# Patient Record
Sex: Female | Born: 1970 | Race: Black or African American | Hispanic: No | Marital: Single | Smoking: Never smoker
Health system: Southern US, Community
[De-identification: ages and names within clinical notes are randomized; demographics above are authoritative.]

## PROBLEM LIST (undated history)

## (undated) ENCOUNTER — Inpatient Hospital Stay (HOSPITAL_COMMUNITY): Payer: Self-pay

## (undated) DIAGNOSIS — Z789 Other specified health status: Secondary | ICD-10-CM

---

## 1997-09-04 ENCOUNTER — Inpatient Hospital Stay (HOSPITAL_COMMUNITY): Admission: AD | Admit: 1997-09-04 | Discharge: 1997-09-04 | Payer: Self-pay | Admitting: Obstetrics & Gynecology

## 2002-08-19 ENCOUNTER — Emergency Department (HOSPITAL_COMMUNITY): Admission: EM | Admit: 2002-08-19 | Discharge: 2002-08-19 | Payer: Self-pay | Admitting: Emergency Medicine

## 2002-09-30 ENCOUNTER — Other Ambulatory Visit: Admission: RE | Admit: 2002-09-30 | Discharge: 2002-09-30 | Payer: Self-pay | Admitting: *Deleted

## 2008-03-16 ENCOUNTER — Inpatient Hospital Stay (HOSPITAL_COMMUNITY): Admission: AD | Admit: 2008-03-16 | Discharge: 2008-03-16 | Payer: Self-pay | Admitting: Obstetrics and Gynecology

## 2008-03-19 ENCOUNTER — Inpatient Hospital Stay (HOSPITAL_COMMUNITY): Admission: AD | Admit: 2008-03-19 | Discharge: 2008-03-19 | Payer: Self-pay | Admitting: Family Medicine

## 2009-02-22 ENCOUNTER — Emergency Department (HOSPITAL_BASED_OUTPATIENT_CLINIC_OR_DEPARTMENT_OTHER): Admission: EM | Admit: 2009-02-22 | Discharge: 2009-02-22 | Payer: Self-pay | Admitting: Emergency Medicine

## 2009-09-15 ENCOUNTER — Inpatient Hospital Stay (HOSPITAL_COMMUNITY): Admission: AD | Admit: 2009-09-15 | Discharge: 2009-09-16 | Payer: Self-pay | Admitting: Obstetrics and Gynecology

## 2009-09-18 ENCOUNTER — Ambulatory Visit (HOSPITAL_COMMUNITY): Admission: RE | Admit: 2009-09-18 | Discharge: 2009-09-18 | Payer: Self-pay | Admitting: Obstetrics & Gynecology

## 2009-09-30 ENCOUNTER — Encounter: Payer: Self-pay | Admitting: Obstetrics and Gynecology

## 2009-09-30 ENCOUNTER — Ambulatory Visit: Payer: Self-pay | Admitting: Obstetrics and Gynecology

## 2009-10-14 ENCOUNTER — Ambulatory Visit: Payer: Self-pay | Admitting: Obstetrics and Gynecology

## 2010-08-10 LAB — POCT URINALYSIS DIP (DEVICE)
Bilirubin Urine: NEGATIVE
Glucose, UA: NEGATIVE mg/dL
Hgb urine dipstick: NEGATIVE
Nitrite: NEGATIVE
Specific Gravity, Urine: 1.02 (ref 1.005–1.030)
Urobilinogen, UA: 0.2 mg/dL (ref 0.0–1.0)
pH: 7 (ref 5.0–8.0)

## 2010-08-10 LAB — URINALYSIS, ROUTINE W REFLEX MICROSCOPIC
Ketones, ur: NEGATIVE mg/dL
Leukocytes, UA: NEGATIVE
Urobilinogen, UA: 0.2 mg/dL (ref 0.0–1.0)

## 2010-08-10 LAB — WET PREP, GENITAL

## 2010-08-10 LAB — URINE MICROSCOPIC-ADD ON: WBC, UA: NONE SEEN WBC/hpf (ref <3)

## 2010-08-10 LAB — CBC
Hemoglobin: 11.7 g/dL — ABNORMAL LOW (ref 12.0–15.0)
MCV: 86.4 fL (ref 78.0–100.0)
RBC: 4.11 MIL/uL (ref 3.87–5.11)
RDW: 14.4 % (ref 11.5–15.5)

## 2010-08-10 LAB — HCG, QUANTITATIVE, PREGNANCY
hCG, Beta Chain, Quant, S: 155 m[IU]/mL — ABNORMAL HIGH (ref <5)
hCG, Beta Chain, Quant, S: 368 m[IU]/mL — ABNORMAL HIGH (ref <5)

## 2010-08-10 LAB — GC/CHLAMYDIA PROBE AMP, GENITAL: GC Probe Amp, Genital: NEGATIVE

## 2010-10-05 NOTE — Assessment & Plan Note (Signed)
NAME:  Diamond Gutierrez, [place]SE NO.:  [id number]   MEDICAL RECORD NO.:  [id number]          PATIENT TYPE:  WOC   LOCATION:  [facility] at [facility]         FACILITY:  [facility]   PHYSICIAN:  Argentina Donovan, MD        DATE OF BIRTH:  [birth year]   DATE OF SERVICE:  09/30/2009                                  CLINIC NOTE   REASON FOR VISIT:  Followup probable SAB.   HISTORY:  This is a 40 year old G19, P 2-0-2-2 AA female who is here for  her first visit after being seen in [facility] on September 15, 2009, and September 18, 2009.  In addition to her followup visit, she is interested in getting a  Pap smear, very concerned that she has not had one for several years and  also has not had a physical for a long time.  When she was seen on September 15, 2009, she had had menstrual-like bleeding 5 days prior to the visit.  Her LMP, she thinks, was around July 30, 2009, which would have placed  her somewhere around 6 weeks, at the time of her first visit, on that  day her [name] was 70 and there was nothing seen in the uterus, trace of  free fluid and adnexa were normal.  She returned to [facility]U on April 29,  still having some light bleeding and her quant was down to 155.  She  continued to have very light spotting until about 3 days ago when she  has had no further spotting.  She is okay with becoming pregnant, but is  not really trying to get pregnant.  Her children are in high school and  college.  She does not like birth control because she is afraid of the  risks of estrogen and that when she used the patch, it left spots on her  skin and when she used Depo-Provera, she gained weight.  She also states  she has been with the same partner for 19 years and rarely has  intercourse.   ALLERGIES:  None.   CURRENT MEDICATIONS:  No prescription meds.  She does take bee pollen  which she thinks helps with her weight loss and she does colon cleansing  which is formulation from the health food service, she has been  on for  about a year.  She states her usual pattern of constipation before using  this was having one bowel movement a week with some straining and  feeling like she did not completely emptied her bowels.  Since she has  been using the colon cleanse, she is doing much better and is only  concerned of being on it for a long time.  She is very motivated to lose  weight and has in fact gotten down to her weight today of 165 from being  as high as 199.  She has joined a gym and does regular exercise and she  has tried to adjust her diet to promote weight loss.   IMMUNIZATION HISTORY:  She has had the usual immunizations.   MENSTRUAL HISTORY:  She has regular monthly cycles with medium flow,  very mild cramping.  CONTRACEPTIVE HISTORY:  As above.   OBSTETRICAL HISTORY:  She had 1 NSVD and 1 C-section.  Last year, she  also had a early SAB.   GYNECOLOGIC HISTORY:  It has been several years since the last Pap, but  she has never had an abnormal Pap.  Never had a mammogram.   SURGERIES:  None.   BLOOD TRANSFUSION:  None.   FAMILY HISTORY:  Significant for grandmother with diabetes and mother  with hypertension.  No history of cancer or blood clots in her family.   PAST MEDICAL HISTORY:  Completely negative.  However, she has not been  getting any routine primary care.   SOCIAL HISTORY:  Lives with daughter and fiance.  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  She is a nonsmoker.  Drinks socially caffeinated  beverages 1-2 a day.  No history of IV drug use or abuse.   REVIEW OF SYMPTOMS:  Negative, except she reports some blood seen in the  stools during the time she was having her miscarriage, so she is  uncertain whether that isolated instances due to straining of the stool  or was menstrual or was blood with the SAB.  She denies any vaginal  itching, odor or dyspareunia.   PHYSICAL EXAMINATION:  VITAL SIGNS:  BP 132/82, pulse 72, weight 165,  height 5 feet 5 inches.   GENERAL:  WN, WD in NAD.  She seems a bit anxious.  HEENT:  Normocephalic.  Good dentition.  NECK:  Thyroid WNL.  BREASTS:  Enlarged with no discrete mass.  No nipple discharge.  No  lymphadenopathy.  ABDOMEN:  Soft, nontender.  No organomegaly noted.  EXTREMITIES:  Without varicosities.  PELVIC:  NEFG.  Good tone and support.  Vagina; pink, rugated.  Cervix;  posterior, clean, parous os.  Bimanual, uterus NSSP.  No cervical motion  tenderness.  No adnexal tenderness or masses.   IMPRESSION AND PLAN:  Status post spontaneous abortion is most likely.  Today, we will repeat her quant and if it is down to 0, she will not  need it again, otherwise we will need to follow down to 0 because there  was never an IUP documented.  Health care maintenance, she is praised on  her weight loss and her motivation enjoying in the gym.  We discussed  diet at length regarding weight reduction and regarding consultation.  She is advised to decrease her use of the colon cleanse and instead  adjust her diet to add high-fiber foods.  Pap smear is done, so followup  will be pending results of that.  Also urinalysis is done which is  negative and this is reviewed with the patient.     ______________________________  Caren Griffins, CNM    ______________________________  Argentina Donovan, MD    DP/MEDQ  D:  09/30/2009  T:  10/01/2009  Job:  [id number]

## 2011-02-22 LAB — CBC
HCT: 34.1 — ABNORMAL LOW
MCV: 80.5
RDW: 16.5 — ABNORMAL HIGH

## 2011-02-22 LAB — URINE CULTURE

## 2011-02-22 LAB — GC/CHLAMYDIA PROBE AMP, GENITAL
Chlamydia, DNA Probe: NEGATIVE
GC Probe Amp, Genital: NEGATIVE

## 2011-02-22 LAB — ABO/RH: ABO/RH(D): A POS

## 2011-02-22 LAB — POCT PREGNANCY, URINE: Preg Test, Ur: POSITIVE

## 2011-02-22 LAB — URINALYSIS, ROUTINE W REFLEX MICROSCOPIC
Glucose, UA: NEGATIVE
Ketones, ur: NEGATIVE
Protein, ur: NEGATIVE
pH: 7.5

## 2011-02-22 LAB — WET PREP, GENITAL
Clue Cells Wet Prep HPF POC: NONE SEEN
Trich, Wet Prep: NONE SEEN

## 2011-02-22 LAB — URINE MICROSCOPIC-ADD ON

## 2011-02-22 LAB — HCG, QUANTITATIVE, PREGNANCY: hCG, Beta Chain, Quant, S: 1061 — ABNORMAL HIGH

## 2011-06-04 ENCOUNTER — Inpatient Hospital Stay (HOSPITAL_COMMUNITY): Payer: Self-pay

## 2011-06-04 ENCOUNTER — Encounter (HOSPITAL_COMMUNITY): Payer: Self-pay | Admitting: Obstetrics and Gynecology

## 2011-06-04 ENCOUNTER — Inpatient Hospital Stay (HOSPITAL_COMMUNITY)
Admission: AD | Admit: 2011-06-04 | Discharge: 2011-06-04 | Disposition: A | Discharge status: Home / Self Care | Payer: Self-pay | Source: Ambulatory Visit | Attending: Obstetrics and Gynecology | Admitting: Obstetrics and Gynecology

## 2011-06-04 DIAGNOSIS — N83209 Unspecified ovarian cyst, unspecified side: Secondary | ICD-10-CM | POA: Insufficient documentation

## 2011-06-04 DIAGNOSIS — N83299 Other ovarian cyst, unspecified side: Secondary | ICD-10-CM

## 2011-06-04 DIAGNOSIS — O210 Mild hyperemesis gravidarum: Secondary | ICD-10-CM | POA: Insufficient documentation

## 2011-06-04 DIAGNOSIS — O219 Vomiting of pregnancy, unspecified: Secondary | ICD-10-CM

## 2011-06-04 DIAGNOSIS — O34599 Maternal care for other abnormalities of gravid uterus, unspecified trimester: Secondary | ICD-10-CM | POA: Insufficient documentation

## 2011-06-04 HISTORY — DX: Other specified health status: Z78.9

## 2011-06-04 LAB — WET PREP, GENITAL: Trich, Wet Prep: NONE SEEN

## 2011-06-04 LAB — DIFFERENTIAL
Basophils Absolute: 0 10*3/uL (ref 0.0–0.1)
Basophils Relative: 0 % (ref 0–1)
Eosinophils Absolute: 0.2 10*3/uL (ref 0.0–0.7)
Monocytes Relative: 17 % — ABNORMAL HIGH (ref 3–12)
Neutro Abs: 4.2 10*3/uL (ref 1.7–7.7)
Neutrophils Relative %: 57 % (ref 43–77)

## 2011-06-04 LAB — URINALYSIS, ROUTINE W REFLEX MICROSCOPIC
Bilirubin Urine: NEGATIVE
Ketones, ur: 15 mg/dL — AB
Nitrite: NEGATIVE
pH: 7 (ref 5.0–8.0)

## 2011-06-04 LAB — URINE MICROSCOPIC-ADD ON

## 2011-06-04 LAB — CBC
Hemoglobin: 11.8 g/dL — ABNORMAL LOW (ref 12.0–15.0)
MCH: 27.8 pg (ref 26.0–34.0)
MCHC: 33.3 g/dL (ref 30.0–36.0)
Platelets: 343 10*3/uL (ref 150–400)
RDW: 14.5 % (ref 11.5–15.5)

## 2011-06-04 LAB — HCG, QUANTITATIVE, PREGNANCY: hCG, Beta Chain, Quant, S: 62378 m[IU]/mL — ABNORMAL HIGH (ref <5)

## 2011-06-04 LAB — ABO/RH: ABO/RH(D): A POS

## 2011-06-04 MED ORDER — ONDANSETRON 8 MG PO TBDP
8.0000 mg | ORAL_TABLET | Freq: Once | ORAL | Status: AC
  Administered 2011-06-04: 8 mg via ORAL
  Filled 2011-06-04: qty 1

## 2011-06-04 MED ORDER — HYDROCODONE-ACETAMINOPHEN 5-325 MG PO TABS: 2.0000 | ORAL_TABLET | ORAL | Status: AC | PRN

## 2011-06-04 MED ORDER — PROMETHAZINE HCL 25 MG PO TABS: 12.5000 mg | ORAL_TABLET | Freq: Four times a day (QID) | ORAL | Status: AC | PRN

## 2011-06-04 NOTE — Progress Notes (Signed)
Pt states, " I have missed my December period, and now I've had vomiting late night and early morning for 3 wks. I want to be sure I'm not pregnant in my tubes. My low back has been hurting for two weeks."

## 2011-06-04 NOTE — ED Provider Notes (Signed)
History     CSN: [id number]  Arrival date & time 06/04/11  0202   None     Chief Complaint  Patient presents with  . Emesis  . Back Pain    HPI Diamond Gutierrez is a 41 y.o. female who presents to [facility]U for nausea and vomiting in early pregnancy. She also complains of low back pain radiating to the lower abdomen. The history was provided by the patient.   Past Medical History  Diagnosis Date  . No pertinent past medical history     Past Surgical History  Procedure Date  . Cesarean section 11/23/88    History reviewed. No pertinent family history.  History  Substance Use Topics  . Smoking status: Never Smoker   . Smokeless tobacco: Not on file  . Alcohol Use: Yes     "I used to occasionally drink, but not now."    OB History    Grav Para Term Preterm Abortions TAB SAB Ect Mult Living   4 2 2  1  1   2       Review of Systems  Constitutional: Positive for appetite change. Negative for fever and unexpected weight change.  HENT: Positive for congestion.   Eyes: Negative.   Respiratory: Negative for cough and wheezing.   Cardiovascular: Negative.   Gastrointestinal: Positive for nausea and vomiting. Negative for diarrhea and constipation. Abdominal pain: cramping.  Genitourinary: Positive for frequency. Negative for dysuria, urgency, vaginal bleeding, vaginal discharge and pelvic pain.  Musculoskeletal: Negative for back pain.  Skin: Negative for rash.  Neurological: Positive for headaches. Negative for dizziness and syncope.  Psychiatric/Behavioral: The patient is not nervous/anxious.     Allergies  Review of patient's allergies indicates no known allergies.  Home Medications  No current outpatient prescriptions on file.  BP 119/100  Pulse 94  Temp(Src) 98.4 F (36.9 C) (Oral)  Resp 16  Ht 5' 5.5" (1.664 m)  Wt 171 lb (77.565 kg)  BMI 28.02 kg/m2  LMP 04/02/2011  Physical Exam  Nursing note and vitals reviewed. Constitutional: She is oriented to person,  place, and time. She appears well-developed and well-nourished. No distress.       Elevated BP  HENT:  Head: Normocephalic.  Eyes: EOM are normal.  Neck: Neck supple.  Cardiovascular: Normal rate.   Pulmonary/Chest: Effort normal.  Abdominal: Soft. There is no tenderness.  Genitourinary:       External genitalia without lesions. White discharge vaginal vault. Cervix closed, long, no CMT, right adnexal tenderness. Uterus approximately 8 to 10 week size.  Musculoskeletal: Normal range of motion.  Neurological: She is alert and oriented to person, place, and time. No cranial nerve deficit.  Skin: Skin is warm and dry.  Psychiatric: She has a normal mood and affect. Her behavior is normal. Judgment and thought content normal.    Results for orders placed during the hospital encounter of 06/04/11 (from the past 24 hour(s))  URINALYSIS, ROUTINE W REFLEX MICROSCOPIC     Status: Abnormal   Collection Time   06/04/11  2:20 AM      Component Value Range   Color, Urine YELLOW  YELLOW    APPearance CLEAR  CLEAR    Specific Gravity, Urine 1.020  1.005 - 1.030    pH 7.0  5.0 - 8.0    Glucose, UA NEGATIVE  NEGATIVE (mg/dL)   Hgb urine dipstick TRACE (*) NEGATIVE    Bilirubin Urine NEGATIVE  NEGATIVE    Ketones, ur 15 (*)  NEGATIVE (mg/dL)   Protein, ur NEGATIVE  NEGATIVE (mg/dL)   Urobilinogen, UA 0.2  0.0 - 1.0 (mg/dL)   Nitrite NEGATIVE  NEGATIVE    Leukocytes, UA NEGATIVE  NEGATIVE   URINE MICROSCOPIC-ADD ON     Status: Abnormal   Collection Time   06/04/11  2:20 AM      Component Value Range   Squamous Epithelial / LPF FEW (*) RARE    RBC / HPF 0-2  <3 (RBC/hpf)   Bacteria, UA RARE  RARE   POCT PREGNANCY, URINE     Status: Normal   Collection Time   06/04/11  2:41 AM      Component Value Range   Preg Test, Ur POSITIVE    CBC     Status: Abnormal   Collection Time   06/04/11  2:46 AM      Component Value Range   WBC 7.4  4.0 - 10.5 (K/uL)   RBC 4.24  3.87 - 5.11 (MIL/uL)    Hemoglobin 11.8 (*) 12.0 - 15.0 (g/dL)   HCT 47.8 (*) 29.5 - 46.0 (%)   MCV 83.5  78.0 - 100.0 (fL)   MCH 27.8  26.0 - 34.0 (pg)   MCHC 33.3  30.0 - 36.0 (g/dL)   RDW 62.1  30.8 - 65.7 (%)   Platelets 343  150 - 400 (K/uL)  DIFFERENTIAL     Status: Abnormal   Collection Time   06/04/11  2:46 AM      Component Value Range   Neutrophils Relative 57  43 - 77 (%)   Neutro Abs 4.2  1.7 - 7.7 (K/uL)   Lymphocytes Relative 24  12 - 46 (%)   Lymphs Abs 1.8  0.7 - 4.0 (K/uL)   Monocytes Relative 17 (*) 3 - 12 (%)   Monocytes Absolute 1.3 (*) 0.1 - 1.0 (K/uL)   Eosinophils Relative 2  0 - 5 (%)   Eosinophils Absolute 0.2  0.0 - 0.7 (K/uL)   Basophils Relative 0  0 - 1 (%)   Basophils Absolute 0.0  0.0 - 0.1 (K/uL)  HCG, QUANTITATIVE, PREGNANCY     Status: Abnormal   Collection Time   06/04/11  2:46 AM      Component Value Range   hCG, Beta Chain, Quant, S [id number] (*) <5 (mIU/mL)  ABO/RH     Status: Normal   Collection Time   06/04/11  2:46 AM      Component Value Range   ABO/RH(D) A POS     US Ob Comp Less 14 Wks  06/04/2011  *RADIOLOGY REPORT*  Clinical Data: Pelvic pain.  OBSTETRIC <14 WK ULTRASOUND  Technique:  Transabdominal ultrasound was performed for evaluation of the gestation as well as the maternal uterus and adnexal regions.  Comparison:  Pelvic ultrasound performed 09/15/2009  Intrauterine gestational sac: Visualized/normal in shape. Yolk sac: Yes Embryo: Yes Cardiac Activity: Yes Heart Rate: 167 bpm  CRL:  2.1 cm  8w  6d          [place] EDC: 01/08/2012  Maternal uterus/Adnexae: No subchorionic hemorrhage is noted.  The uterus is otherwise unremarkable in appearance.  There is a large right ovarian cyst, measuring 5.5 x 5.1 x 4.0 cm. The right ovary measures 6.8 x 6.1 x 4.6 cm.  The left ovary is unremarkable in appearance, measuring 2.3 x 1.4 x 0.8 cm.  There is no evidence for ovarian torsion; no abnormal blood flow is seen with respect to the right ovarian cyst.  No free fluid is seen  within the pelvic cul-de-sac.  IMPRESSION:  1.  Single live intrauterine pregnancy noted, with a crown-rump length of 2.1 cm, corresponding to a gestational age of [redacted] weeks 6 days.  This matches the gestational age of [redacted] weeks 0 days by LMP, reflecting an estimated date of delivery of January 07, 2012. 2.  Large 5.5 cm right ovarian cyst noted.  Though this may be physiologic in nature, follow-up during the upcoming anatomic ultrasound survey would be helpful to ensure stability.  Original Report Authenticated By: Tonia Ghent, M.D.   Assessment: Viable IUP at 8 weeks and 6 days gestation   Right ovarian cyst   Nausea and vomiting in pregnancy  Plan:  Rx Phenergan   Rx Vicodin for pain   Start prenatal care.   Return here as needed. ED Course  Procedures   MDM          Kerrie Buffalo, NP 06/04/11 [phone]

## 2011-06-04 NOTE — Progress Notes (Signed)
"  I have had some N/V for 3 weeks.  Mainly at night, but it seems to be getting worse now.  I missed my period in December."

## 2011-06-05 NOTE — ED Provider Notes (Signed)
Attestation of Attending Supervision of Advanced Practitioner: Evaluation and management procedures were performed by the PA/NP/CNM/OB Fellow under my supervision/collaboration. Chart reviewed and agree with management and plan.  Earlie Schank V 06/05/2011 4:25 PM

## 2011-06-06 LAB — GC/CHLAMYDIA PROBE AMP, GENITAL
Chlamydia, DNA Probe: NEGATIVE
GC Probe Amp, Genital: NEGATIVE

## 2011-06-17 ENCOUNTER — Ambulatory Visit: Payer: Self-pay | Admitting: Physician Assistant

## 2011-07-04 ENCOUNTER — Ambulatory Visit: Payer: Self-pay | Admitting: Family

## 2012-10-07 IMAGING — US US OB COMP LESS 14 WK
1 series · 13 of 25 positions shown · non-contrast
Comparison: Pelvic ultrasound performed 09/15/2009

CLINICAL DATA: Pelvic pain.

OBSTETRIC <14 WK ULTRASOUND
TECHNIQUE: Transabdominal ultrasound was performed for evaluation
of the gestation as well as the maternal uterus and adnexal
regions.

[Series 1: us ob comp less 14 wks · 25 acquisitions, 13 frames shown]
[im 1/25]
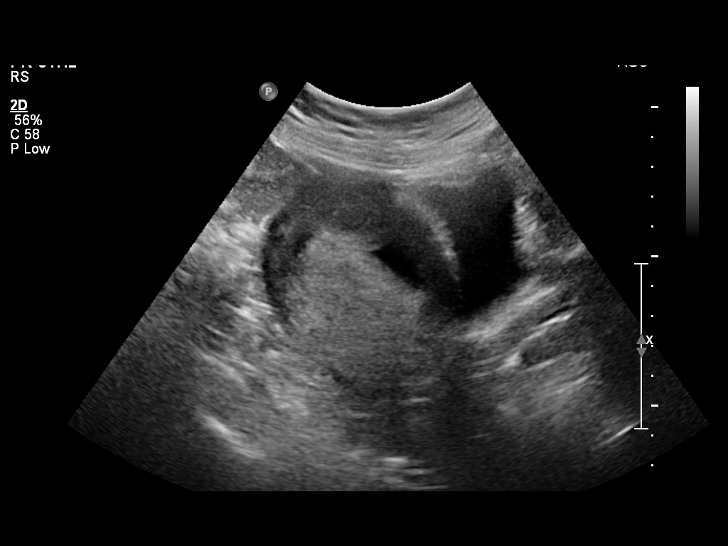
[im 3/25]
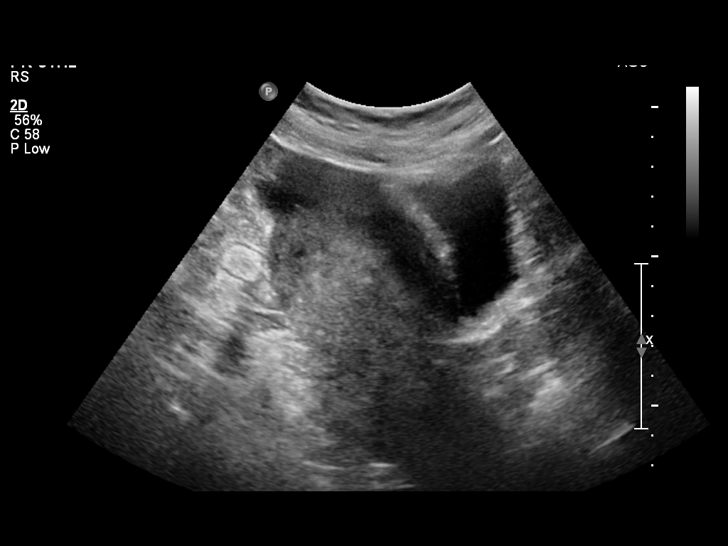
[im 5/25]
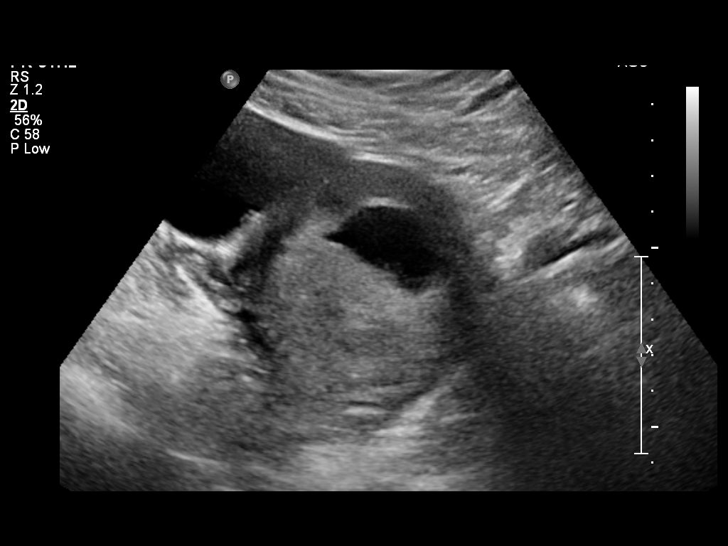
[im 7/25]
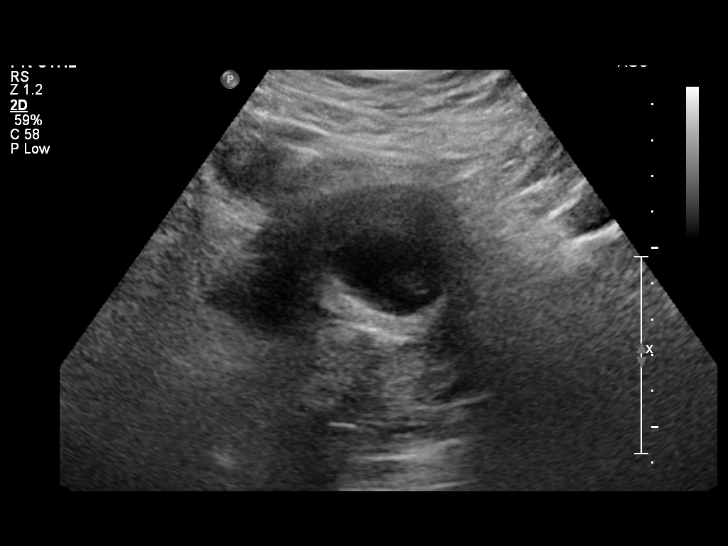
[im 9/25]
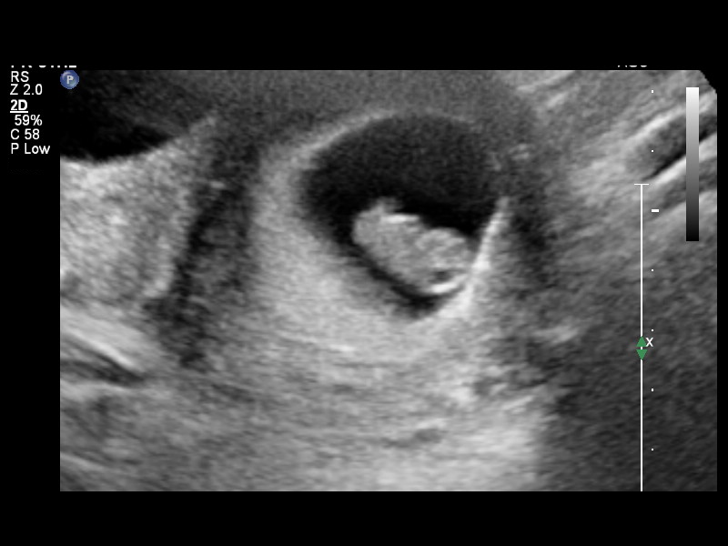
[im 11/25]
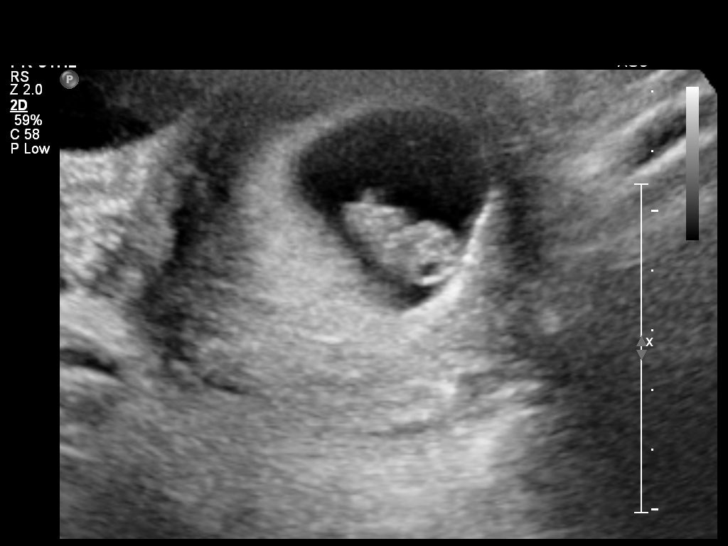
[im 13/25]
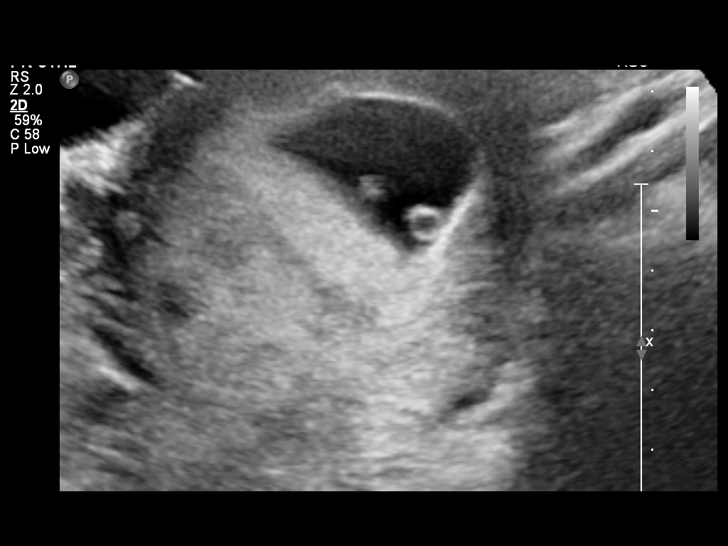
[im 15/25]
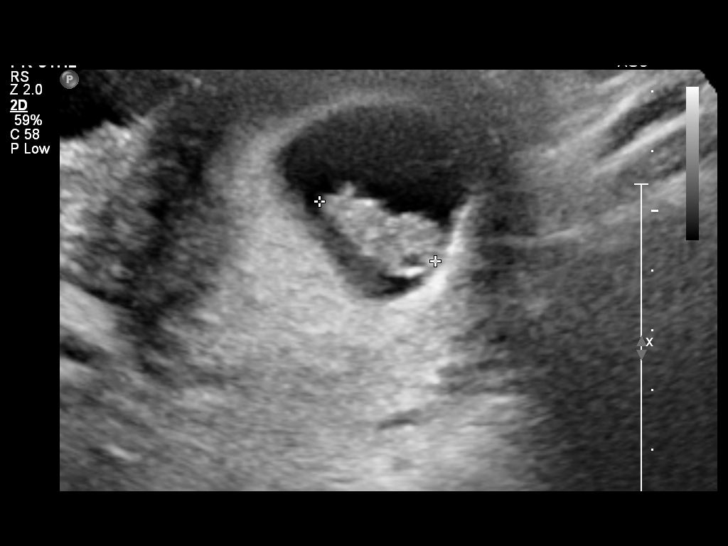
[im 17/25]
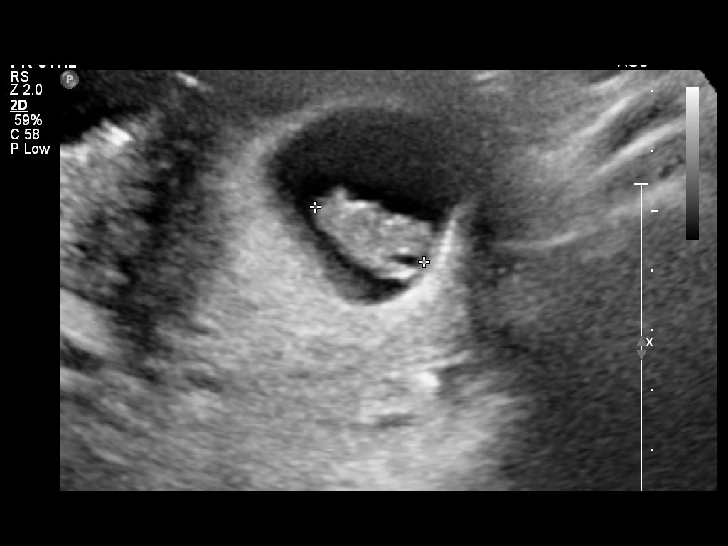
[im 19/25]
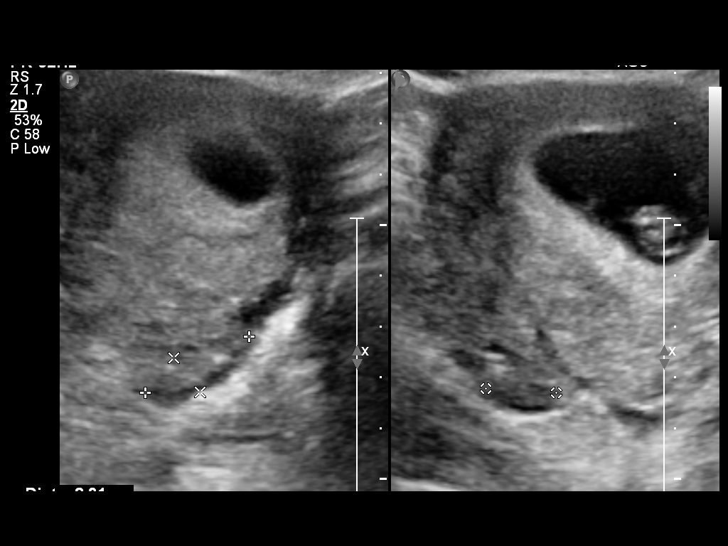
[im 21/25]
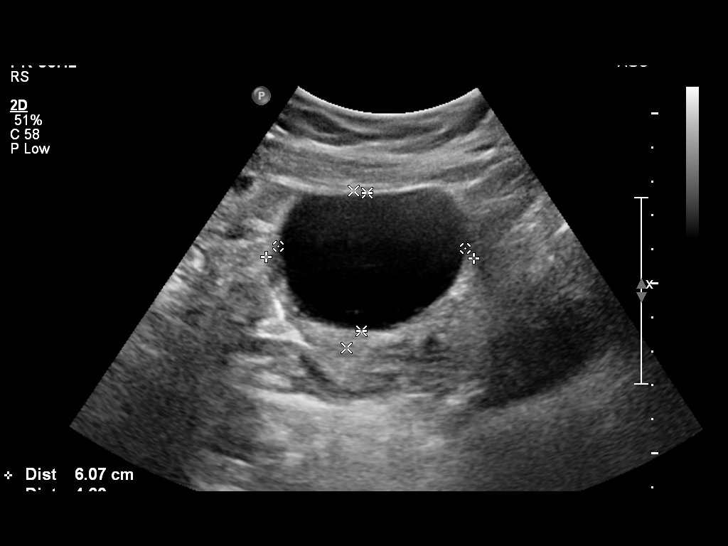
[im 23/25]
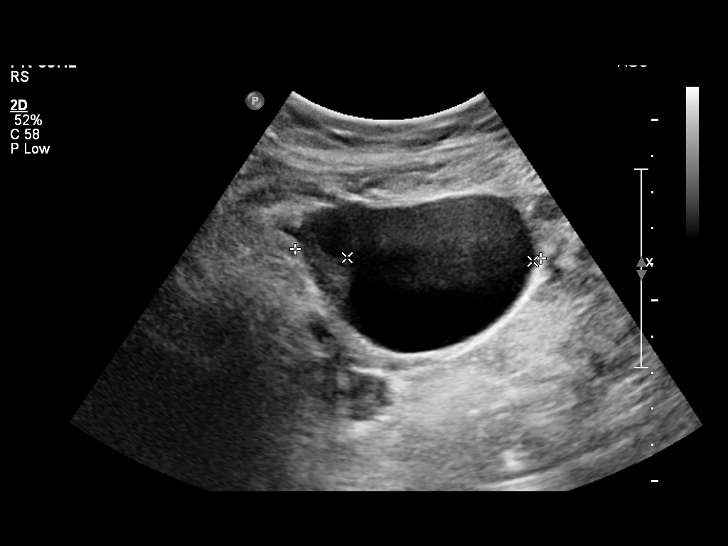
[im 25/25]
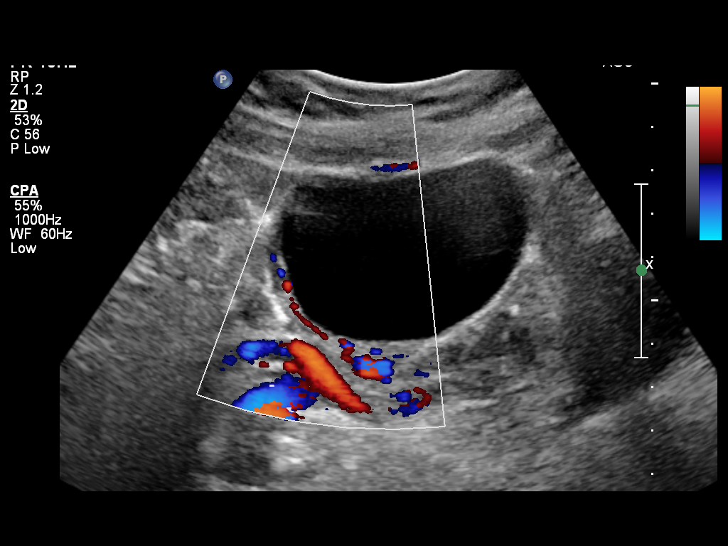

[13 of 25 positions shown; findings below may reference images not displayed]

Intrauterine gestational sac: Visualized/normal in shape.
Yolk sac: Yes
Embryo: Yes
Cardiac Activity: Yes
Heart Rate: 167 bpm

CRL:  2.1 cm  8w  6d          [place] EDC: 01/08/2012

Maternal uterus/Adnexae:
No subchorionic hemorrhage is noted.  The uterus is otherwise
unremarkable in appearance.

There is a large right ovarian cyst, measuring 5.5 x 5.1 x 4.0 cm.
The right ovary measures 6.8 x 6.1 x 4.6 cm.  The left ovary is
unremarkable in appearance, measuring 2.3 x 1.4 x 0.8 cm.  There is
no evidence for ovarian torsion; no abnormal blood flow is seen
with respect to the right ovarian cyst.

No free fluid is seen within the pelvic cul-de-sac.
IMPRESSION: 1.  Single live intrauterine pregnancy noted, with a crown-rump
length of 2.1 cm, corresponding to a gestational age of 8 weeks 6
days.  This matches the gestational age of 9 weeks 0 days by LMP,
reflecting an estimated date of delivery January 07, 2012.
2.  Large 5.5 cm right ovarian cyst noted.  Though this may be
physiologic in nature, follow-up during the upcoming anatomic
ultrasound survey would be helpful to ensure stability.

## 2013-04-09 ENCOUNTER — Other Ambulatory Visit: Payer: Self-pay

## 2014-03-24 ENCOUNTER — Encounter (HOSPITAL_COMMUNITY): Payer: Self-pay | Admitting: Obstetrics and Gynecology

## 2019-08-05 ENCOUNTER — Ambulatory Visit: Payer: Self-pay

## 2019-08-12 ENCOUNTER — Ambulatory Visit: Payer: Self-pay

## 2019-09-02 ENCOUNTER — Ambulatory Visit: Payer: Self-pay | Attending: Internal Medicine

## 2019-09-02 DIAGNOSIS — Z23 Encounter for immunization: Secondary | ICD-10-CM

## 2019-09-02 NOTE — Progress Notes (Signed)
   Covid-19 Vaccination Clinic  Name:  Diamond Gutierrez    MRN: [id number] DOB: [birth year]  09/02/2019  [name] was observed post Covid-19 immunization for 15 minutes without incident. She was provided with Vaccine Information Sheet and instruction to access the V-Safe system.   [name] was instructed to call 911 with any severe reactions post vaccine: Marland Kitchen Difficulty breathing  . Swelling of face and throat  . A fast heartbeat  . A bad rash all over body  . Dizziness and weakness   Immunizations Administered    Name Date Dose VIS Date Route   Pfizer COVID-19 Vaccine 09/02/2019 12:58 PM 0.3 mL 05/03/2019 Intramuscular   Manufacturer: [employer], [employer]   Lot: [id number]   NDC: [address]-9

## 2019-09-24 ENCOUNTER — Ambulatory Visit: Payer: Self-pay | Attending: Internal Medicine

## 2019-09-24 DIAGNOSIS — Z23 Encounter for immunization: Secondary | ICD-10-CM

## 2019-09-24 NOTE — Progress Notes (Signed)
   Covid-19 Vaccination Clinic  Name:  BETZABETH DERRINGER    MRN: [id number] DOB: [birth year]  09/24/2019  Ms. Zhang was observed post Covid-19 immunization for 15 minutes without incident. She was provided with Vaccine Information Sheet and instruction to access the V-Safe system.   Ms. Castanon was instructed to call 911 with any severe reactions post vaccine: Marland Kitchen Difficulty breathing  . Swelling of face and throat  . A fast heartbeat  . A bad rash all over body  . Dizziness and weakness   Immunizations Administered    Name Date Dose VIS Date Route   Pfizer COVID-19 Vaccine 09/24/2019  8:54 AM 0.3 mL 07/17/2018 Intramuscular   Manufacturer: [employer], [employer]   Lot: [id number]   NDC: [address]-7

## 2020-03-25 ENCOUNTER — Ambulatory Visit: Payer: Self-pay | Admitting: Advanced Practice Midwife

## 2020-04-13 ENCOUNTER — Other Ambulatory Visit: Payer: Self-pay

## 2020-04-13 ENCOUNTER — Ambulatory Visit (INDEPENDENT_AMBULATORY_CARE_PROVIDER_SITE_OTHER): Payer: BLUE CROSS/BLUE SHIELD | Admitting: Advanced Practice Midwife

## 2020-04-13 ENCOUNTER — Inpatient Hospital Stay (HOSPITAL_COMMUNITY): Admit: 2020-04-13 | Payer: BLUE CROSS/BLUE SHIELD

## 2020-04-13 ENCOUNTER — Encounter: Payer: Self-pay | Admitting: Advanced Practice Midwife

## 2020-04-13 VITALS — BP 174/97 | HR 76 | Ht 66.0 in | Wt 177.0 lb

## 2020-04-13 DIAGNOSIS — Z01419 Encounter for gynecological examination (general) (routine) without abnormal findings: Secondary | ICD-10-CM | POA: Diagnosis not present

## 2020-04-13 DIAGNOSIS — Z1231 Encounter for screening mammogram for malignant neoplasm of breast: Secondary | ICD-10-CM

## 2020-04-13 DIAGNOSIS — Z113 Encounter for screening for infections with a predominantly sexual mode of transmission: Secondary | ICD-10-CM | POA: Diagnosis not present

## 2020-04-13 DIAGNOSIS — I10 Essential (primary) hypertension: Secondary | ICD-10-CM

## 2020-04-13 DIAGNOSIS — Z124 Encounter for screening for malignant neoplasm of cervix: Secondary | ICD-10-CM | POA: Diagnosis not present

## 2020-04-13 DIAGNOSIS — A5903 Trichomonal cystitis and urethritis: Secondary | ICD-10-CM

## 2020-04-13 NOTE — Progress Notes (Signed)
New GYN presents for AEX/PAP/STD screening. 

## 2020-04-13 NOTE — Patient Instructions (Signed)
Health Maintenance, Female Adopting a healthy lifestyle and getting preventive care are important in promoting health and wellness. Ask your health care provider about:  The right schedule for you to have regular tests and exams.  Things you can do on your own to prevent diseases and keep yourself healthy. What should I know about diet, weight, and exercise? Eat a healthy diet   Eat a diet that includes plenty of vegetables, fruits, low-fat dairy products, and lean protein.  Do not eat a lot of foods that are high in solid fats, added sugars, or sodium. Maintain a healthy weight Body mass index (BMI) is used to identify weight problems. It estimates body fat based on height and weight. Your health care provider can help determine your BMI and help you achieve or maintain a healthy weight. Get regular exercise Get regular exercise. This is one of the most important things you can do for your health. Most adults should:  Exercise for at least 150 minutes each week. The exercise should increase your heart rate and make you sweat (moderate-intensity exercise).  Do strengthening exercises at least twice a week. This is in addition to the moderate-intensity exercise.  Spend less time sitting. Even light physical activity can be beneficial. Watch cholesterol and blood lipids Have your blood tested for lipids and cholesterol at 49 years of age, then have this test every 5 years. Have your cholesterol levels checked more often if:  Your lipid or cholesterol levels are high.  You are older than 49 years of age.  You are at high risk for heart disease. What should I know about cancer screening? Depending on your health history and family history, you may need to have cancer screening at various ages. This may include screening for:  Breast cancer.  Cervical cancer.  Colorectal cancer.  Skin cancer.  Lung cancer. What should I know about heart disease, diabetes, and high blood  pressure? Blood pressure and heart disease  High blood pressure causes heart disease and increases the risk of stroke. This is more likely to develop in people who have high blood pressure readings, are of African descent, or are overweight.  Have your blood pressure checked: ? Every 3-5 years if you are 18-39 years of age. ? Every year if you are 40 years old or older. Diabetes Have regular diabetes screenings. This checks your fasting blood sugar level. Have the screening done:  Once every three years after age 40 if you are at a normal weight and have a low risk for diabetes.  More often and at a younger age if you are overweight or have a high risk for diabetes. What should I know about preventing infection? Hepatitis B If you have a higher risk for hepatitis B, you should be screened for this virus. Talk with your health care provider to find out if you are at risk for hepatitis B infection. Hepatitis C Testing is recommended for:  Everyone born from 1945 through 1965.  Anyone with known risk factors for hepatitis C. Sexually transmitted infections (STIs)  Get screened for STIs, including gonorrhea and chlamydia, if: ? You are sexually active and are younger than 49 years of age. ? You are older than 49 years of age and your health care provider tells you that you are at risk for this type of infection. ? Your sexual activity has changed since you were last screened, and you are at increased risk for chlamydia or gonorrhea. Ask your health care provider if   you are at risk.  Ask your health care provider about whether you are at high risk for HIV. Your health care provider may recommend a prescription medicine to help prevent HIV infection. If you choose to take medicine to prevent HIV, you should first get tested for HIV. You should then be tested every 3 months for as long as you are taking the medicine. Pregnancy  If you are about to stop having your period (premenopausal) and  you may become pregnant, seek counseling before you get pregnant.  Take 400 to 800 micrograms (mcg) of folic acid every day if you become pregnant.  Ask for birth control (contraception) if you want to prevent pregnancy. Osteoporosis and menopause Osteoporosis is a disease in which the bones lose minerals and strength with aging. This can result in bone fractures. If you are 65 years old or older, or if you are at risk for osteoporosis and fractures, ask your health care provider if you should:  Be screened for bone loss.  Take a calcium or vitamin D supplement to lower your risk of fractures.  Be given hormone replacement therapy (HRT) to treat symptoms of menopause. Follow these instructions at home: Lifestyle  Do not use any products that contain nicotine or tobacco, such as cigarettes, e-cigarettes, and chewing tobacco. If you need help quitting, ask your health care provider.  Do not use street drugs.  Do not share needles.  Ask your health care provider for help if you need support or information about quitting drugs. Alcohol use  Do not drink alcohol if: ? Your health care provider tells you not to drink. ? You are pregnant, may be pregnant, or are planning to become pregnant.  If you drink alcohol: ? Limit how much you use to 0-1 drink a day. ? Limit intake if you are breastfeeding.  Be aware of how much alcohol is in your drink. In the [place] one drink equals one 12 oz bottle of beer (355 mL), one 5 oz glass of wine (148 mL), or one 1 oz glass of hard liquor (44 mL). General instructions  Schedule regular health, dental, and eye exams.  Stay current with your vaccines.  Tell your health care provider if: ? You often feel depressed. ? You have ever been abused or do not feel safe at home. Summary  Adopting a healthy lifestyle and getting preventive care are important in promoting health and wellness.  Follow your health care provider's instructions about healthy  diet, exercising, and getting tested or screened for diseases.  Follow your health care provider's instructions on monitoring your cholesterol and blood pressure. This information is not intended to replace advice given to you by your health care provider. Make sure you discuss any questions you have with your health care provider. Document Revised: 05/02/2018 Document Reviewed: 05/02/2018 Elsevier Patient Education  2020 [employer]  

## 2020-04-13 NOTE — Progress Notes (Signed)
Subjective:     Diamond Gutierrez is a 49 y.o. female here at [facility] for a routine exam.  Current complaints: increased voiding at night and desire to be tested for STIs.  Personal health questionnaire reviewed: yes.  Do you have a primary care provider? Yes Do you feel safe at home? Yes    Office Visit from 04/13/2020 in [facility]  PHQ-2 Total Score 0      Risk factors for chronic health problems: Smoking: Alchohol/how much: Pt BMI: Body mass index is 28.57 kg/m.   Gynecologic History Patient's last menstrual period was 03/18/2020 (approximate). Contraception: none Last Pap: 2011. Results were: normal Last mammogram: none. Results were: n/a  Obstetric History OB History  Gravida Para Term Preterm AB Living  4 2 2   2 2   SAB TAB Ectopic Multiple Live Births  1       2    # Outcome Date GA Lbr Len/2nd Weight Sex Delivery Anes PTL Lv  4 SAB 06/03/10        DEC     Birth Comments: Blighted ovum  3 Term 10/03/93    F Vag-Spont   LIV  2 Term 11/23/88    M CS-LTranv   LIV  1 AB              The following portions of the patient's history were reviewed and updated as appropriate: allergies, current medications, past family history, past medical history, past social history, past surgical history and problem list.  Review of Systems Pertinent items are noted in HPI. Pertinent items noted in HPI and remainder of comprehensive ROS otherwise negative.    Objective:   BP (!) 174/97   Pulse 76   Ht 5\' 6"  (1.676 m)   Wt 80.3 kg   LMP 03/18/2020 (Approximate)   Breastfeeding Unknown   BMI 28.57 kg/m  VS reviewed, nursing note reviewed,  Constitutional: well developed, well nourished, no distress HEENT: normocephalic CV: normal rate Pulm/chest wall: normal effort Breast Exam: Deferred with low risks and shared decision making, discussed recommendation to start mammogram between 40-50 yo/ exam performed: right breast normal without mass, skin  or nipple changes or axillary nodes, left breast normal without mass, skin or nipple changes or axillary nodes Abdomen: soft Neuro: alert and oriented x 3 Skin: warm, dry Psych: affect normal Pelvic exam: Performed: Cervix pink, visually closed, without lesion, scant white creamy discharge, vaginal walls and external genitalia normal Bimanual exam: Cervix 0/long/high, firm, anterior, neg CMT, uterus nontender, nonenlarged, adnexa without tenderness, enlargement, or mass       Assessment/Plan:   1. Women's annual routine gynecological examination -Mammogram ordered    2. Screen for STD (sexually transmitted disease) -Last testing in 2011, patient sexually active -Patient does not report symptoms of STDs  3. Pap smear for cervical cancer screening -Last pap smear in 2011 -normal   Follow up in: 1 year or as needed.   2012, Student-MidWife 3:12 PM   CNM attestation:  I have seen and examined this patient; I agree with above documentation in the midwife student's note.   Diamond Gutierrez is a 49 y.o. [phone] in the [facility] office for routine annual well woman exam. Last Pap was 2011 and was normal. See problem list below.   There are no problems to display for this patient.    ROS, labs, PMH reviewed  PE: BP (!) 174/97   Pulse 76  Ht 5\' 6"  (1.676 m)   Wt 177 lb (80.3 kg)   LMP 03/18/2020 (Approximate)   Breastfeeding Unknown   BMI 28.57 kg/m  Gen: calm comfortable, well appearing HEENT: thyroid without enlargement or mass HEART: normal rate, heart sounds, regular rhythm RESP: normal effort, lung sounds clear and equal bilaterally Abd: soft, no palpable masses  Plan: --F/U in 1 year  1. Women's annual routine gynecological examination  - MM Digital Screening; Future  2. Screen for STD (sexually transmitted disease)  - Cervicovaginal ancillary only( [place]) - Hepatitis C Antibody - Hepatitis B surface antigen - RPR - HIV antibody (with  reflex)  3. Pap smear for cervical cancer screening  - Cytology - PAP( [place])  4. Encounter for screening mammogram for malignant neoplasm of breast   5. Hypertension, unspecified type --Pt newly diagnosed with HTN at PCP today.  Pt not interested in taking HTN medications, plans to try lifestyle changes and will f/u with PCP.    03/20/2020, CNM 3:47 PM

## 2020-04-14 LAB — CERVICOVAGINAL ANCILLARY ONLY
Bacterial Vaginitis (gardnerella): POSITIVE — AB
Candida Glabrata: NEGATIVE
Candida Vaginitis: NEGATIVE
Chlamydia: NEGATIVE
Comment: NEGATIVE
Comment: NEGATIVE
Comment: NEGATIVE
Comment: NEGATIVE
Comment: NEGATIVE
Comment: NORMAL
Neisseria Gonorrhea: NEGATIVE
Trichomonas: POSITIVE — AB

## 2020-04-14 MED ORDER — METRONIDAZOLE 500 MG PO TABS: 500.0000 mg | ORAL_TABLET | Freq: Two times a day (BID) | ORAL | 0 refills | Status: AC

## 2020-04-14 NOTE — Addendum Note (Signed)
Addended by: Sharen Counter A on: 04/14/2020 06:05 PM   Modules accepted: Orders

## 2020-04-15 ENCOUNTER — Telehealth: Payer: Self-pay

## 2020-04-15 NOTE — Telephone Encounter (Signed)
-----   Message from Hurshel Party, CNM sent at 04/14/2020  6:04 PM EST ----- Pt is positive for trichomonas and BV.  I am sending  Flagyl 500 mg PO BID x 7 days to her pharmacy. Please call her to let her know. She does not have MyChart. Thank you.

## 2020-04-15 NOTE — Telephone Encounter (Signed)
Pt called back and was made aware of +BV and Trich results Pt made aware to schedule TOC appt and to refrain from any unprotected intercourse and partner needs treatment.  Pt voiced understanding.

## 2020-04-15 NOTE — Telephone Encounter (Signed)
TC to pt to make aware of +TRICH no answer LVM .

## 2020-04-20 LAB — CYTOLOGY - PAP
Comment: NEGATIVE
Diagnosis: NEGATIVE
High risk HPV: NEGATIVE

## 2020-05-05 ENCOUNTER — Other Ambulatory Visit: Payer: Self-pay | Admitting: Advanced Practice Midwife

## 2020-05-05 ENCOUNTER — Other Ambulatory Visit: Payer: Self-pay

## 2020-05-05 DIAGNOSIS — A599 Trichomoniasis, unspecified: Secondary | ICD-10-CM

## 2020-05-05 MED ORDER — METRONIDAZOLE 500 MG PO TABS: ORAL_TABLET | ORAL | 1 refills | Status: AC

## 2020-05-05 NOTE — Telephone Encounter (Signed)
Spoke with patient requesting rf on Flagyl for Trich.  Pt admitted to having IC with her partner and she is unsure if he was treated. She said she wants to take half of the treatment and give the other half to her partner.  Informed patient I will consult with the provider and call her back.

## 2020-05-05 NOTE — Telephone Encounter (Signed)
After consulting with provider, informed pt we will send treatment for her and her partner to the pharmacy. Reiterated with pt no unprotected IC until fully treated, pt agrees.

## 2020-05-19 ENCOUNTER — Ambulatory Visit: Payer: BLUE CROSS/BLUE SHIELD | Admitting: Advanced Practice Midwife

## 2020-05-19 ENCOUNTER — Other Ambulatory Visit: Payer: Self-pay

## 2020-05-21 ENCOUNTER — Telehealth: Payer: Self-pay | Admitting: Advanced Practice Midwife

## 2020-05-21 NOTE — Telephone Encounter (Signed)
Patient called regarding her bill.  She has out of [employer].  Patient states she asked when scheduling and at check in if we were in network and was told yes by the front office staff.  She in fact is out of network.  I explained that if out of network we will not receive an EOB or payment directly, that if payment is made it would go to her.    Patient states she has not checked her PO Box recently so she is not aware if she has any coorespondence from [place].   Patient stated she will not pay this bill and will bring the bill to [place] when she receives it.
